# Patient Record
Sex: Female | Born: 1980 | Race: White | Hispanic: No | Marital: Married | Smoking: Never smoker
Health system: Southern US, Community
[De-identification: ages and names within clinical notes are randomized; demographics above are authoritative.]

## PROBLEM LIST (undated history)

## (undated) DIAGNOSIS — J329 Chronic sinusitis, unspecified: Secondary | ICD-10-CM

## (undated) DIAGNOSIS — T7840XA Allergy, unspecified, initial encounter: Secondary | ICD-10-CM

## (undated) HISTORY — DX: Chronic sinusitis, unspecified: J32.9

## (undated) HISTORY — DX: Allergy, unspecified, initial encounter: T78.40XA

---

## 2007-10-27 ENCOUNTER — Ambulatory Visit: Admission: RE | Admit: 2007-10-27 | Discharge: 2007-10-27 | Payer: Self-pay | Admitting: Gynecology

## 2011-03-05 NOTE — Consult Note (Signed)
Tamara Keller, KUMAGAI NO.:  [id number]   MEDICAL RECORD NO.:  [id number]          PATIENT TYPE:  OUT   LOCATION:  GYN                          FACILITY:  [facility]   PHYSICIAN:  De Blanch, M.D.DATE OF BIRTH:  [birth year]   DATE OF CONSULTATION:  10/27/2007  DATE OF DISCHARGE:                                 CONSULTATION   A 30 year old white single female seen seeking second opinion regarding  management of abnormal Pap smears.  The patient claims to have been  under the care of Dr. Annett Fabian in [place] since she was 30  years of age.  She apparently has a past history of abnormal Pap smears  that antecede records that I have which date back to 2006.  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  She  had negative margins.  She has documentation of 2 normal Pap smears in  July 2007 and October 2008, and also reports that she had a normal Pap  smear at [facility] in the interval.  She has been retested in  October 2008 for HPV and again shows high-risk types.  The patient has  received Gardasil HPV vaccine.   We had a discussion of approximately 20 minutes face-to-face time  regarding her past history.  The patient is very knowledgeable about HPV  cervical dysplasia.  In the end, we recommend that she have Pap smear  every 6 months over an indefinite period of time.  She had a number of  questions regarding the impact of LEEP procedure on subsequent  pregnancies and I indicated that while there was a slight increased risk  of preterm delivery that this is relatively infrequent.  Clearly if she  did develop further dysplasia, careful consideration should be given as  to the mode of treatment and avoidance of excessive excision of the  cervix would be most  appropriate.  All of her questions were answered  and she will return to her primary gynecologist for continuing  surveillance and gynecologic care.      De Blanch, M.D.  Electronically Signed     DC/MEDQ  D:  10/27/2007  T:  10/28/2007  Job:  [id number]   cc:   Annett Fabian, M.D.  [place], [place], R.N.  [phone] [address]  [place], [place] [address]

## 2011-11-23 LAB — HM PAP SMEAR: HM Pap smear: NORMAL

## 2013-03-22 ENCOUNTER — Encounter: Payer: Self-pay | Admitting: Internal Medicine

## 2013-03-22 ENCOUNTER — Ambulatory Visit (INDEPENDENT_AMBULATORY_CARE_PROVIDER_SITE_OTHER): Payer: BC Managed Care – PPO | Admitting: Internal Medicine

## 2013-03-22 VITALS — BP 122/70 | HR 78 | Temp 98.3°F | Ht 66.0 in | Wt 144.0 lb

## 2013-03-22 DIAGNOSIS — M629 Disorder of muscle, unspecified: Secondary | ICD-10-CM

## 2013-03-22 DIAGNOSIS — M7630 Iliotibial band syndrome, unspecified leg: Secondary | ICD-10-CM

## 2013-03-22 DIAGNOSIS — Z0000 Encounter for general adult medical examination without abnormal findings: Secondary | ICD-10-CM

## 2013-03-22 DIAGNOSIS — J329 Chronic sinusitis, unspecified: Secondary | ICD-10-CM | POA: Insufficient documentation

## 2013-03-22 NOTE — Assessment & Plan Note (Signed)
Symptoms are consistent with chronic sinusitis. No improvement with antihistamines, nasal steroids. Will set up ENT evaluation. Question if obstruction playing a role in symptoms. Also question whether repeat allergy testing might be helpful.

## 2013-03-22 NOTE — Assessment & Plan Note (Signed)
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 Discussed using roller bar for massage and changing to non-impact activities for a few weeks. If symptoms are persistent, would favor sports medicine evaluation.

## 2013-03-22 NOTE — Progress Notes (Signed)
Subjective:    Patient ID: Tamara Keller, female    DOB: 1980/11/29, 32 y.o.   MRN: [id number]  HPI 31YO female presents to establish care. Generally healthy throughout her life. Only concern is chronic sinus infections. Low level congestion at baseline with exacerbations 2-3 times per year. Has tried OTC non-sedating antihistamines, nasal afrin, and nasal steroids in the past with no improvement. Had allergy testing in distant past which was inconclusive. Never had ENT evaluation. No h/o facial trauma or surgery.  Also concerned about "clicking" in her knees when running. Typically runs about 2-3 miles several times per week. No pain or swelling in knee. No pain in leg. Clicking sound first noted few months ago. No h/o injury. Tries to run on soft surface and has good shoes.  No outpatient encounter prescriptions on file as of 03/22/2013.   No facility-administered encounter medications on file as of 03/22/2013.   BP 122/70  Pulse 78  Temp(Src) 98.3 F (36.8 C) (Oral)  Ht 5\' 6"  (1.676 m)  Wt 144 lb (65.318 kg)  BMI 23.25 kg/m2  SpO2 98%  LMP 03/15/2013  Review of Systems  Constitutional: Negative for fever, chills, appetite change, fatigue and unexpected weight change.  HENT: Positive for congestion and sinus pressure. Negative for ear pain, sore throat, trouble swallowing, neck pain and voice change.   Eyes: Negative for visual disturbance.  Respiratory: Negative for cough, shortness of breath, wheezing and stridor.   Cardiovascular: Negative for chest pain, palpitations and leg swelling.  Gastrointestinal: Negative for nausea, vomiting, abdominal pain, diarrhea, constipation, blood in stool, abdominal distention and anal bleeding.  Genitourinary: Negative for dysuria and flank pain.  Musculoskeletal: Negative for myalgias, arthralgias and gait problem.  Skin: Negative for color change and rash.  Neurological: Negative for dizziness and headaches.  Hematological: Negative for  adenopathy. Does not bruise/bleed easily.  Psychiatric/Behavioral: Negative for suicidal ideas, sleep disturbance and dysphoric mood. The patient is not nervous/anxious.        Objective:   Physical Exam  Constitutional: She is oriented to person, place, and time. She appears well-developed and well-nourished. No distress.  HENT:  Head: Normocephalic and atraumatic.  Right Ear: External ear and ear canal normal. A middle ear effusion is present.  Left Ear: External ear and ear canal normal. A middle ear effusion is present.  Nose: Nose normal.  Mouth/Throat: Oropharynx is clear and moist. No oropharyngeal exudate.  Eyes: Conjunctivae are normal. Pupils are equal, round, and reactive to light. Right eye exhibits no discharge. Left eye exhibits no discharge. No scleral icterus.  Neck: Normal range of motion. Neck supple. No tracheal deviation present. No thyromegaly present.  Cardiovascular: Normal rate, regular rhythm, normal heart sounds and intact distal pulses.  Exam reveals no gallop and no friction rub.   No murmur heard. Pulmonary/Chest: Effort normal and breath sounds normal. No accessory muscle usage. Not tachypneic. No respiratory distress. She has no decreased breath sounds. She has no wheezes. She has no rhonchi. She has no rales. She exhibits no tenderness.  Abdominal: Soft. Bowel sounds are normal. She exhibits no distension. There is no tenderness. There is no rebound.  Musculoskeletal: Normal range of motion. She exhibits no edema and no tenderness.       Right knee: She exhibits normal range of motion, no swelling, no effusion, no deformity, normal alignment, no LCL laxity and normal patellar mobility. No tenderness found.       Left knee: She exhibits normal range of motion, no swelling,  no effusion, no deformity, normal alignment, no LCL laxity and normal patellar mobility. No tenderness found.  Lymphadenopathy:    She has no cervical adenopathy.  Neurological: She is alert  and oriented to person, place, and time. No cranial nerve deficit. She exhibits normal muscle tone. Coordination normal.  Skin: Skin is warm and dry. No rash noted. She is not diaphoretic. No erythema. No pallor.  Psychiatric: She has a normal mood and affect. Her behavior is normal. Judgment and thought content normal.          Assessment & Plan:

## 2013-06-30 ENCOUNTER — Telehealth: Payer: Self-pay | Admitting: Internal Medicine

## 2013-06-30 NOTE — Telephone Encounter (Signed)
Fwd to Dr. Walker 

## 2013-06-30 NOTE — Telephone Encounter (Signed)
Referral to a Gastroenterologist and a Dermatologist as soon as possible.

## 2013-06-30 NOTE — Telephone Encounter (Signed)
Pt needs a visit to set up referrals.

## 2013-07-01 NOTE — Telephone Encounter (Signed)
Left detailed message on patient voicemail informing her to call the office and schedule an appointment.

## 2013-07-01 NOTE — Telephone Encounter (Signed)
Left message to call back  

## 2013-07-02 ENCOUNTER — Telehealth: Payer: Self-pay | Admitting: Internal Medicine

## 2013-07-02 NOTE — Telephone Encounter (Signed)
Pt asking Dr. Dan Humphreys to give her a call regarding an issue, pt wants a referral and is having to come from [place] to see Dr. Dan Humphreys just to get a referral.

## 2013-07-05 ENCOUNTER — Ambulatory Visit: Payer: BC Managed Care – PPO | Admitting: Internal Medicine

## 2013-07-05 ENCOUNTER — Telehealth: Payer: Self-pay | Admitting: Internal Medicine

## 2013-07-05 NOTE — Telephone Encounter (Signed)
Patient had scheduled an appointment for today then cancelled it.

## 2013-07-05 NOTE — Telephone Encounter (Signed)
Pt called to cancel her appt with Dr. Dan Humphreys today due to starting menstrual cycle and appt was to be for vaginal examination.  Pt would like Dr. Dan Humphreys to call her.  Pt is upset, states she did not receive a call back previously.

## 2013-07-05 NOTE — Telephone Encounter (Signed)
Left message to call back, patient could have asked to speak with me when she called to cancel or schedule her appointment. Left her a message in a previous encounter to call back but no return call was given to me.

## 2013-07-06 NOTE — Telephone Encounter (Signed)
Left message to call back  

## 2013-07-06 NOTE — Telephone Encounter (Signed)
Spoke with patient, informed her to come in discuss why she needs a referral that is the reason she needs an appointment. Stated she had already made an appointment and hung the phone up.

## 2017-05-13 ENCOUNTER — Ambulatory Visit: Payer: Self-pay | Admitting: Obstetrics and Gynecology

## 2017-06-06 ENCOUNTER — Other Ambulatory Visit
Admission: RE | Admit: 2017-06-06 | Discharge: 2017-06-06 | Disposition: A | Discharge status: Home / Self Care | Payer: Self-pay | Source: Ambulatory Visit | Attending: Obstetrics & Gynecology | Admitting: Obstetrics & Gynecology

## 2017-06-06 DIAGNOSIS — O00101 Right tubal pregnancy without intrauterine pregnancy: Secondary | ICD-10-CM | POA: Insufficient documentation

## 2017-06-06 LAB — HCG, QUANTITATIVE, PREGNANCY: HCG, BETA CHAIN, QUANT, S: 6612 m[IU]/mL — AB (ref <5)

## 2017-06-13 ENCOUNTER — Observation Stay
Admission: EM | Admit: 2017-06-13 | Discharge: 2017-06-15 | Disposition: A | Discharge status: Home / Self Care | Payer: BLUE CROSS/BLUE SHIELD | Attending: Obstetrics and Gynecology | Admitting: Obstetrics and Gynecology

## 2017-06-13 ENCOUNTER — Encounter: Payer: Self-pay | Admitting: *Deleted

## 2017-06-13 ENCOUNTER — Emergency Department: Payer: BLUE CROSS/BLUE SHIELD

## 2017-06-13 DIAGNOSIS — R102 Pelvic and perineal pain: Secondary | ICD-10-CM | POA: Diagnosis present

## 2017-06-13 DIAGNOSIS — O00101 Right tubal pregnancy without intrauterine pregnancy: Secondary | ICD-10-CM | POA: Diagnosis not present

## 2017-06-13 DIAGNOSIS — R109 Unspecified abdominal pain: Secondary | ICD-10-CM | POA: Diagnosis present

## 2017-06-13 DIAGNOSIS — Z8759 Personal history of other complications of pregnancy, childbirth and the puerperium: Secondary | ICD-10-CM

## 2017-06-13 LAB — CBC WITH DIFFERENTIAL/PLATELET
Basophils Absolute: 0.1 10*3/uL (ref 0–0.1)
Basophils Relative: 1 %
EOS PCT: 3 %
Eosinophils Absolute: 0.3 10*3/uL (ref 0–0.7)
HEMATOCRIT: 38.8 % (ref 35.0–47.0)
Hemoglobin: 13.3 g/dL (ref 12.0–16.0)
LYMPHS PCT: 47 %
Lymphs Abs: 4.2 10*3/uL — ABNORMAL HIGH (ref 1.0–3.6)
MCH: 30.8 pg (ref 26.0–34.0)
MCHC: 34.2 g/dL (ref 32.0–36.0)
MCV: 90.2 fL (ref 80.0–100.0)
MONO ABS: 0.6 10*3/uL (ref 0.2–0.9)
MONOS PCT: 7 %
NEUTROS ABS: 3.9 10*3/uL (ref 1.4–6.5)
Neutrophils Relative %: 42 %
Platelets: 239 10*3/uL (ref 150–440)
RBC: 4.3 MIL/uL (ref 3.80–5.20)
RDW: 12.1 % (ref 11.5–14.5)
WBC: 9.1 10*3/uL (ref 3.6–11.0)

## 2017-06-13 LAB — BASIC METABOLIC PANEL
ANION GAP: 11 (ref 5–15)
BUN: 13 mg/dL (ref 6–20)
CHLORIDE: 104 mmol/L (ref 101–111)
CO2: 23 mmol/L (ref 22–32)
Calcium: 9.6 mg/dL (ref 8.9–10.3)
Creatinine, Ser: 0.93 mg/dL (ref 0.44–1.00)
GFR calc non Af Amer: >60 mL/min (ref >60)
Glucose, Bld: 144 mg/dL — ABNORMAL HIGH (ref 65–99)
POTASSIUM: 3.3 mmol/L — AB (ref 3.5–5.1)
Sodium: 138 mmol/L (ref 135–145)

## 2017-06-13 LAB — PROTIME-INR
INR: 1
Prothrombin Time: 13.2 seconds (ref 11.4–15.2)

## 2017-06-13 LAB — TYPE AND SCREEN
ABO/RH(D): O POS
Antibody Screen: NEGATIVE

## 2017-06-13 LAB — APTT: APTT: 27 s (ref 24–36)

## 2017-06-13 LAB — HCG, QUANTITATIVE, PREGNANCY: hCG, Beta Chain, Quant, S: 2258 m[IU]/mL — ABNORMAL HIGH (ref <5)

## 2017-06-13 MED ORDER — MORPHINE SULFATE (PF) 4 MG/ML IV SOLN: 4.0000 mg | Freq: Once | INTRAVENOUS | Status: DC

## 2017-06-13 MED ORDER — LACTATED RINGERS IV SOLN
INTRAVENOUS | Status: DC
  Administered 2017-06-13 – 2017-06-14 (×5): via INTRAVENOUS

## 2017-06-13 MED ORDER — ONDANSETRON HCL 4 MG/2ML IJ SOLN
4.0000 mg | Freq: Once | INTRAMUSCULAR | Status: AC
  Administered 2017-06-13: 4 mg via INTRAVENOUS
  Filled 2017-06-13: qty 2

## 2017-06-13 MED ORDER — FLUTICASONE PROPIONATE 50 MCG/ACT NA SUSP
1.0000 | NASAL | Status: DC | PRN
  Administered 2017-06-13: 1 via NASAL
  Filled 2017-06-13: qty 16

## 2017-06-13 MED ORDER — SODIUM CHLORIDE 0.9 % IV BOLUS (SEPSIS)
1000.0000 mL | Freq: Once | INTRAVENOUS | Status: AC
  Administered 2017-06-13: 1000 mL via INTRAVENOUS

## 2017-06-13 MED ORDER — KETOROLAC TROMETHAMINE 30 MG/ML IJ SOLN
15.0000 mg | Freq: Once | INTRAMUSCULAR | Status: AC
  Administered 2017-06-13: 15 mg via INTRAVENOUS
  Filled 2017-06-13: qty 1

## 2017-06-13 MED ORDER — ZOLPIDEM TARTRATE 5 MG PO TABS
5.0000 mg | ORAL_TABLET | Freq: Every evening | ORAL | Status: DC | PRN
Start: 2017-06-13 — End: 2017-06-15

## 2017-06-13 MED ORDER — KETOROLAC TROMETHAMINE 30 MG/ML IJ SOLN: 15.0000 mg | Freq: Three times a day (TID) | INTRAMUSCULAR | Status: DC | PRN

## 2017-06-13 MED ORDER — SODIUM CHLORIDE 0.9 % IV SOLN
Freq: Once | INTRAVENOUS | Status: AC
  Administered 2017-06-13: 14:00:00 via INTRAVENOUS

## 2017-06-13 MED ORDER — KETOROLAC TROMETHAMINE 15 MG/ML IJ SOLN
15.0000 mg | Freq: Three times a day (TID) | INTRAMUSCULAR | Status: DC | PRN
  Administered 2017-06-14 (×2): 15 mg via INTRAVENOUS
  Filled 2017-06-13 (×3): qty 1

## 2017-06-13 NOTE — ED Notes (Signed)
Assisted the patient to the bathroom to void. She got up with assistance and had no issues.

## 2017-06-13 NOTE — ED Provider Notes (Signed)
[facility] Emergency Department Provider Note  ____________________________________________  Time seen: Approximately 11:34 AM  I have reviewed the triage vital signs and the nursing notes.   HISTORY  Chief Complaint Abdominal Pain   HPI Tamara Keller is a 36 y.o. female currently being treated for an ectopic pregnancy with methotrexate who presents for evaluation of abdominal pain and vaginal bleeding. Patient received methotrexate 2 weeks ago. She initially had a small amount of bleeding however that had subsided. Todayshe started to have spotting which she describes as a small amount of thick blood. That was associated with severe lower cramping abdominal pain. Patient started feeling like she was going to pass out which prompted her husband to drive her to the emergency room. Patient denies passing blood clots or hemorrhaging. She denies chest pain or shortness of breath.  Past Medical History:  Diagnosis Date  . Allergy    seasonal   . Sinusitis    chronic, 2-3times per year    Patient Active Problem List   Diagnosis Date Noted  . Abdominal pain 06/13/2017  . Sinusitis, chronic 03/22/2013  . 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 No pertinent surgical history.  Prior to Admission medications   Medication Sig Start Date End Date Taking? Authorizing Provider  fluticasone (FLONASE) 50 MCG/ACT nasal spray Place 2 sprays into both nostrils daily. 05/07/17  Yes [provider]  LORazepam (ATIVAN) 0.5 MG tablet Take 0.5 mg by mouth every 8 (eight) hours as needed for anxiety. 05/19/17  Yes [provider]    Allergies Patient has no known allergies.  Family History  Problem Relation Age of Onset  . Arthritis Mother   . Hypertension Mother   . Mental illness Mother   . Alcohol abuse Father   . Mental illness Father   . Diabetes Maternal Grandmother   . Cancer Maternal Grandfather        lung  .  Arthritis Paternal Grandmother     Social History Social History  Substance Use Topics  . Smoking status: Never Smoker  . Smokeless tobacco: Not on file  . Alcohol use No    Review of Systems  Constitutional: Negative for fever. Eyes: Negative for visual changes. ENT: Negative for sore throat. Neck: No neck pain  Cardiovascular: Negative for chest pain. Respiratory: Negative for shortness of breath. Gastrointestinal: + lower abdominal pain. No vomiting or diarrhea. Genitourinary: Negative for dysuria. + vaginal bleeding Musculoskeletal: Negative for back pain. Skin: Negative for rash. Neurological: Negative for headaches, weakness or numbness. Psych: No SI or HI  ____________________________________________   PHYSICAL EXAM:  VITAL SIGNS: ED Triage Vitals  Enc Vitals Group     BP 06/13/17 1054 102/71     Pulse Rate 06/13/17 1054 (!) 58     Resp 06/13/17 1054 20     Temp 06/13/17 1054 97.7 F (36.5 C)     Temp Source 06/13/17 1054 Oral     SpO2 06/13/17 1054 100 %     Weight 06/13/17 1054 160 lb (72.6 kg)     Height 06/13/17 1054 5\' 7"  (1.702 m)     Head Circumference --      Peak Flow --      Pain Score 06/13/17 1053 5     Pain Loc --      Pain Edu? --      Excl. in GC? --     Constitutional: Alert and oriented, pale, diaphoretic.  HEENT:  Head: Normocephalic and atraumatic.         Eyes: Conjunctivae are normal. Sclera is non-icteric.       Mouth/Throat: Mucous membranes are moist.       Neck: Supple with no signs of meningismus. Cardiovascular: Regular rate and rhythm. No murmurs, gallops, or rubs. 2+ symmetrical distal pulses are present in all extremities. No JVD. Respiratory: Normal respiratory effort. Lungs are clear to auscultation bilaterally. No wheezes, crackles, or rhonchi.  Gastrointestinal: Soft, significant ttp over the lower quadrants worse in the suprapubic region. No rebound or guarding Musculoskeletal: Nontender with normal range of  motion in all extremities. No edema, cyanosis, or erythema of extremities. Neurologic: Normal speech and language. Face is symmetric. Moving all extremities. No gross focal neurologic deficits are appreciated. Skin: Skin is warm, dry and intact. No rash noted. Psychiatric: Mood and affect are normal. Speech and behavior are normal.  ____________________________________________   LABS (all labs ordered are listed, but only abnormal results are displayed)  Labs Reviewed  CBC WITH DIFFERENTIAL/PLATELET - Abnormal; Notable for the following:       Result Value   Lymphs Abs 4.2 (*)    All other components within normal limits  HCG, QUANTITATIVE, PREGNANCY - Abnormal; Notable for the following:    hCG, Beta Chain, Quant, S 2,258 (*)    All other components within normal limits  BASIC METABOLIC PANEL - Abnormal; Notable for the following:    Potassium 3.3 (*)    Glucose, Bld 144 (*)    All other components within normal limits  PROTIME-INR  APTT  TYPE AND SCREEN   ____________________________________________  EKG  none  ____________________________________________  RADIOLOGY  TVUS: 3.1 cm complex but predominantly solid right paraovarian mass is noted which may represent ectopic pregnancy or residual ectopic pregnancy. Correlation with beta HCG levels is recommended. Critical Value/emergent results were called by telephone at the time of interpretation on 06/13/2017 at 1:13 pm to Dr. Nita Sickle , who verbally acknowledged these results. ____________________________________________   PROCEDURES  Procedure(s) performed:yes Procedures   FAST BEDSIDE US Indication: ectopic pregnancy and severe abdominal pain  3 Views obtained: Splenorenal, Morrison's Pouch, Retrovesical, Pericardial No free fluid in abdomen No difficulty obtaining views. I personally performed and interrepreted the images  Critical Care performed: yes  CRITICAL CARE Performed by: Nita Sickle  ?  Total critical care time: 35 min  Critical care time was exclusive of separately billable procedures and treating other patients.  Critical care was necessary to treat or prevent imminent or life-threatening deterioration.  Critical care was time spent personally by me on the following activities: development of treatment plan with patient and/or surrogate as well as nursing, discussions with consultants, evaluation of patient's response to treatment, examination of patient, obtaining history from patient or surrogate, ordering and performing treatments and interventions, ordering and review of laboratory studies, ordering and review of radiographic studies, pulse oximetry and re-evaluation of patient's condition.  ____________________________________________   INITIAL IMPRESSION / ASSESSMENT AND PLAN / ED COURSE  36 y.o. female currently being treated for an ectopic pregnancy with methotrexate who presents for evaluation of abdominal pain and vaginal bleeding and near syncope. Patient arrives pale and diaphoretic, slightly hypotensive with bradycardia. Bedside ultrasound with no free fluid. 2 large bore IVs were placed and patient was started on normal saline with improvement of her blood pressure. Hemoglobin stable at 13.3. Coags within normal limits. Type and screen was sent. Patient was given Toradol for pain. Beta Quant is pending.  Discussed with Dr. Feliberto Gottron who recommended repeating TVUS which has been ordered.     _________________________ 3:50 PM on 06/13/2017 -----------------------------------------  Ultrasound consistent with persistent ectopic pregnancy. Patient's vital signs improved with IV fluids. She was admitted to OB/GYN for further management.  Pertinent labs & imaging results that were available during my care of the patient were reviewed by me and considered in my medical decision making (see chart for  details).    ____________________________________________   FINAL CLINICAL IMPRESSION(S) / ED DIAGNOSES  Final diagnoses:  Abdominal pain  Right tubal pregnancy without intrauterine pregnancy      NEW MEDICATIONS STARTED DURING THIS VISIT:  New Prescriptions   No medications on file     Note:  This document was prepared using Dragon voice recognition software and may include unintentional dictation errors.    Don Perking, [place], MD 06/13/17 [phone]

## 2017-06-13 NOTE — H&P (Addendum)
Tamara Keller is an 36 y.o. female being followed for an ectopic pregnancy . EGA 7+2 weeks by LMP . She has been under the care of Dr Elesa Massed when she presented to the ED with right sided pain and light- headedness and emesis . Pain sharp and on right . STill spottting . She is s/p MTX tx on 8/10 18 . Quants have decrease  .from 12,408 on 05/27/17 to [id number]--10,[id number]--,6612-- to 2258 today. U/s today show a comples right adnexal mass 3.1 cm which is increased in size from 2.7 cm from 05/27/17.   Pertinent Gynecological History: lmp 04/23/17 Menstrual History:  Patient's last menstrual period was 06/13/2017.    Past Medical History:  Diagnosis Date  . Allergy    seasonal   . Sinusitis    chronic, 2-3times per year    History reviewed. No pertinent surgical history.  Family History  Problem Relation Age of Onset  . Arthritis Mother   . Hypertension Mother   . Mental illness Mother   . Alcohol abuse Father   . Mental illness Father   . Diabetes Maternal Grandmother   . Cancer Maternal Grandfather        lung  . Arthritis Paternal Grandmother     Social History:  reports that she has never smoked. She does not have any smokeless tobacco history on file. She reports that she does not drink alcohol or use drugs.  Allergies: No Known Allergies   (Not in a hospital admission)  ROS Review of Systems: General:                      No fatigue or weight loss Eyes:                           No vision changes Ears:                            No hearing difficulty Respiratory:                No cough or shortness of breath Pulmonary:                  No asthma or shortness of breath Cardiovascular:           No chest pain, palpitations, dyspnea on exertion Gastrointestinal:          No abdominal bloating, chronic diarrhea, constipation, masses, pain or hematochezia Genitourinary:             No hematuria, dysuria, abnormal vaginal discharge, + pelvic pain right ectopic ,  Menometrorrhagia Lymphatic:                   No swollen lymph nodes Musculoskeletal:         No muscle weakness Neurologic:                  No extremity weakness, syncope, seizure disorder Psychiatric:                  No history of depression, delusions or suicidal/homicidal ideation    Blood pressure 108/61, pulse (!) 58, temperature 97.7 F (36.5 C), temperature source Oral, resp. rate 13, height 5\' 7"  (1.702 m), weight 72.6 kg (160 lb), last menstrual period 06/13/2017, SpO2 100 %. Physical Exam   wdwn w female in NAD  Lungs CTA  CV RRR ADb soft  NT , no rebound  Pelvic bimanual :  cx no CMT  + right adnexal fullness and 1-2+ TTP . Left no mass NT    Results for orders placed or performed during the hospital encounter of 06/13/17 (from the past 24 hour(s))  CBC with Differential/Platelet     Status: Abnormal   Collection Time: 06/13/17 11:02 AM  Result Value Ref Range   WBC 9.1 3.6 - 11.0 K/uL   RBC 4.30 3.80 - 5.20 MIL/uL   Hemoglobin 13.3 12.0 - 16.0 g/dL   HCT 16.1 09.6 - 04.5 %   MCV 90.2 80.0 - 100.0 fL   MCH 30.8 26.0 - 34.0 pg   MCHC 34.2 32.0 - 36.0 g/dL   RDW 40.9 81.1 - 91.4 %   Platelets 239 150 - 440 K/uL   Neutrophils Relative % 42 %   Neutro Abs 3.9 1.4 - 6.5 K/uL   Lymphocytes Relative 47 %   Lymphs Abs 4.2 (H) 1.0 - 3.6 K/uL   Monocytes Relative 7 %   Monocytes Absolute 0.6 0.2 - 0.9 K/uL   Eosinophils Relative 3 %   Eosinophils Absolute 0.3 0 - 0.7 K/uL   Basophils Relative 1 %   Basophils Absolute 0.1 0 - 0.1 K/uL  Type and screen     Status: None   Collection Time: 06/13/17 11:02 AM  Result Value Ref Range   ABO/RH(D) O POS    Antibody Screen NEG    Sample Expiration 06/16/2017   Basic metabolic panel     Status: Abnormal   Collection Time: 06/13/17 11:02 AM  Result Value Ref Range   Sodium 138 135 - 145 mmol/L   Potassium 3.3 (L) 3.5 - 5.1 mmol/L   Chloride 104 101 - 111 mmol/L   CO2 23 22 - 32 mmol/L   Glucose, Bld 144 (H) 65 - 99  mg/dL   BUN 13 6 - 20 mg/dL   Creatinine, Ser 7.82 0.44 - 1.00 mg/dL   Calcium 9.6 8.9 - 95.6 mg/dL   GFR calc non Af Amer >60 >60 mL/min   GFR calc Af Amer >60 >60 mL/min   Anion gap 11 5 - 15  Protime-INR     Status: None   Collection Time: 06/13/17 11:02 AM  Result Value Ref Range   Prothrombin Time 13.2 11.4 - 15.2 seconds   INR 1.00   APTT     Status: None   Collection Time: 06/13/17 11:02 AM  Result Value Ref Range   aPTT 27 24 - 36 seconds  hCG, quantitative, pregnancy     Status: Abnormal   Collection Time: 06/13/17 11:03 AM  Result Value Ref Range   hCG, Beta Chain, Quant, S 2,258 (H) <5 mIU/mL    US Transvaginal Non-ob  Result Date: 06/13/2017 CLINICAL DATA:  Pelvic pain status post methotrexate treatment for ectopic pregnancy. EXAM: TRANSABDOMINAL AND TRANSVAGINAL ULTRASOUND OF PELVIS TECHNIQUE: Both transabdominal and transvaginal ultrasound examinations of the pelvis were performed. Transabdominal technique was performed for global imaging of the pelvis including uterus, ovaries, adnexal regions, and pelvic cul-de-sac. It was necessary to proceed with endovaginal exam following the transabdominal exam to visualize the ovaries and adnexal regions. COMPARISON:  None. FINDINGS: Uterus Measurements: 6.7 x 4.2 x 2.7 cm. No fibroids or other mass visualized. Endometrium Thickness: 2.8 mm which is within normal limits. No focal abnormality visualized. Right ovary Measurements: 3.3 x 2.1 x 1.9 cm. Follicular cysts are noted in the right ovary. However, complex but predominantly solid  abnormality measuring 3.1 x 2.9 x 2.8 cm is noted adjacent to right ovary which may represent ectopic pregnancy or residual ectopic pregnancy. Clinical correlation and correlation with beta HCG levels is recommended. Left ovary Measurements: 2.6 x 1.4 x 1.3 cm. Normal appearance/no adnexal mass. Other findings Small amount of free fluid is noted in Morrison's pouch which may be related to ectopic pregnancy.  IMPRESSION: 3.1 cm complex but predominantly solid right paraovarian mass is noted which may represent ectopic pregnancy or residual ectopic pregnancy. Correlation with beta HCG levels is recommended. Critical Value/emergent results were called by telephone at the time of interpretation on 06/13/2017 at 1:13 pm to Dr. Nita Sickle , who verbally acknowledged these results. Electronically Signed   By: Lupita Raider, M.D.   On: 06/13/2017 13:13   US Pelvis Complete  Result Date: 06/13/2017 CLINICAL DATA:  Pelvic pain status post methotrexate treatment for ectopic pregnancy. EXAM: TRANSABDOMINAL AND TRANSVAGINAL ULTRASOUND OF PELVIS TECHNIQUE: Both transabdominal and transvaginal ultrasound examinations of the pelvis were performed. Transabdominal technique was performed for global imaging of the pelvis including uterus, ovaries, adnexal regions, and pelvic cul-de-sac. It was necessary to proceed with endovaginal exam following the transabdominal exam to visualize the ovaries and adnexal regions. COMPARISON:  None. FINDINGS: Uterus Measurements: 6.7 x 4.2 x 2.7 cm. No fibroids or other mass visualized. Endometrium Thickness: 2.8 mm which is within normal limits. No focal abnormality visualized. Right ovary Measurements: 3.3 x 2.1 x 1.9 cm. Follicular cysts are noted in the right ovary. However, complex but predominantly solid abnormality measuring 3.1 x 2.9 x 2.8 cm is noted adjacent to right ovary which may represent ectopic pregnancy or residual ectopic pregnancy. Clinical correlation and correlation with beta HCG levels is recommended. Left ovary Measurements: 2.6 x 1.4 x 1.3 cm. Normal appearance/no adnexal mass. Other findings Small amount of free fluid is noted in Morrison's pouch which may be related to ectopic pregnancy. IMPRESSION: 3.1 cm complex but predominantly solid right paraovarian mass is noted which may represent ectopic pregnancy or residual ectopic pregnancy. Correlation with beta HCG  levels is recommended. Critical Value/emergent results were called by telephone at the time of interpretation on 06/13/2017 at 1:13 pm to Dr. Nita Sickle , who verbally acknowledged these results. Electronically Signed   By: Lupita Raider, M.D.   On: 06/13/2017 13:13    Assessment/Plan: Right tubal ectopic  With acute worsening of pain today . MAss slightly enlarged for last u/s on 05/27/17 . Quants are decreasing appropriately for MTX treatment .  No fluid in pelvis.  I am concerned that that with her pain that she could have been rupturing her fallopian tube but too soon to see a build up of blood .  My recommendation to her is a L/S evaluation and possible right salpingostomy/ salpingectomy  With possibility of a right oophorectomy if an ovarian ectopic is seen .  Pt declines this treatment currently even after full explanation  and wishes to undergo close observation . She is aware that ectopic pregnancies can bleed very quickly and her life could be endangered if this were to happen . Therefore I will observe her  Overnight and repeat an u/s and HCG in am . If she does not have appreciable pain overnight / tomorrow and there is still no increased fluid in the pelvic and hcg continues to drop  I can continue to follow . If PAin increases and/ or there is  free fluid in the pelvis tomorrow or quant  rises I will again recommend to the pt the need for surgery .   Ihor Austin Amalia Edgecombe 06/13/2017, 3:25 PM

## 2017-06-13 NOTE — ED Notes (Signed)
OB MD at bedside

## 2017-06-13 NOTE — ED Notes (Signed)
The EKG was completed and singed by Dr. Mayford Knife. The EKG was also exported into the system.

## 2017-06-13 NOTE — ED Triage Notes (Signed)
Pt was treated by Dr Elesa Massed 2 weeks ago for an etopic pregancy, pt woke up today with increased vaginal bleeding and abdominal cramping, pt is pale and diaphoretic, pt alert and oriented, pt taken back to room 13

## 2017-06-14 ENCOUNTER — Observation Stay: Payer: BLUE CROSS/BLUE SHIELD | Admitting: Certified Registered Nurse Anesthetist

## 2017-06-14 ENCOUNTER — Observation Stay: Payer: BLUE CROSS/BLUE SHIELD

## 2017-06-14 ENCOUNTER — Encounter: Payer: Self-pay | Admitting: Certified Registered Nurse Anesthetist

## 2017-06-14 ENCOUNTER — Encounter
Admission: EM | Disposition: A | Discharge status: Home / Self Care | Payer: Self-pay | Source: Home / Self Care | Attending: Emergency Medicine

## 2017-06-14 HISTORY — PX: DIAGNOSTIC LAPAROSCOPY WITH REMOVAL OF ECTOPIC PREGNANCY: SHX6449

## 2017-06-14 LAB — CBC
HCT: 32.2 % — ABNORMAL LOW (ref 35.0–47.0)
Hemoglobin: 11.2 g/dL — ABNORMAL LOW (ref 12.0–16.0)
MCH: 31.6 pg (ref 26.0–34.0)
MCHC: 34.7 g/dL (ref 32.0–36.0)
MCV: 90.9 fL (ref 80.0–100.0)
PLATELETS: 154 10*3/uL (ref 150–440)
RBC: 3.54 MIL/uL — AB (ref 3.80–5.20)
RDW: 11.8 % (ref 11.5–14.5)
WBC: 5.4 10*3/uL (ref 3.6–11.0)

## 2017-06-14 LAB — HCG, QUANTITATIVE, PREGNANCY: HCG, BETA CHAIN, QUANT, S: 1572 m[IU]/mL — AB (ref <5)

## 2017-06-14 SURGERY — LAPAROSCOPY, WITH ECTOPIC PREGNANCY SURGICAL TREATMENT
Anesthesia: General | Wound class: Clean Contaminated

## 2017-06-14 MED ORDER — FENTANYL CITRATE (PF) 100 MCG/2ML IJ SOLN
25.0000 ug | INTRAMUSCULAR | Status: DC | PRN
  Administered 2017-06-14: 25 ug via INTRAVENOUS

## 2017-06-14 MED ORDER — FENTANYL CITRATE (PF) 100 MCG/2ML IJ SOLN
INTRAMUSCULAR | Status: AC
  Filled 2017-06-14: qty 2

## 2017-06-14 MED ORDER — KETOROLAC TROMETHAMINE 30 MG/ML IJ SOLN
INTRAMUSCULAR | Status: DC | PRN
  Administered 2017-06-14: 30 mg via INTRAVENOUS

## 2017-06-14 MED ORDER — LIDOCAINE HCL (CARDIAC) 20 MG/ML IV SOLN
INTRAVENOUS | Status: DC | PRN
  Administered 2017-06-14: 80 mg via INTRAVENOUS

## 2017-06-14 MED ORDER — MIDAZOLAM HCL 2 MG/2ML IJ SOLN
INTRAMUSCULAR | Status: DC | PRN
  Administered 2017-06-14: 2 mg via INTRAVENOUS

## 2017-06-14 MED ORDER — DEXAMETHASONE SODIUM PHOSPHATE 10 MG/ML IJ SOLN
INTRAMUSCULAR | Status: DC | PRN
  Administered 2017-06-14: 10 mg via INTRAVENOUS

## 2017-06-14 MED ORDER — ROCURONIUM BROMIDE 100 MG/10ML IV SOLN
INTRAVENOUS | Status: DC | PRN
  Administered 2017-06-14 (×2): 10 mg via INTRAVENOUS

## 2017-06-14 MED ORDER — PROPOFOL 10 MG/ML IV BOLUS
INTRAVENOUS | Status: DC | PRN
  Administered 2017-06-14: 140 mg via INTRAVENOUS
  Administered 2017-06-14: 60 mg via INTRAVENOUS

## 2017-06-14 MED ORDER — GLYCOPYRROLATE 0.2 MG/ML IJ SOLN
INTRAMUSCULAR | Status: AC
  Filled 2017-06-14: qty 1

## 2017-06-14 MED ORDER — MIDAZOLAM HCL 2 MG/2ML IJ SOLN
INTRAMUSCULAR | Status: AC
  Filled 2017-06-14: qty 2

## 2017-06-14 MED ORDER — FENTANYL CITRATE (PF) 100 MCG/2ML IJ SOLN
INTRAMUSCULAR | Status: AC
  Administered 2017-06-14: 25 ug via INTRAVENOUS
  Filled 2017-06-14: qty 2

## 2017-06-14 MED ORDER — ONDANSETRON HCL 4 MG/2ML IJ SOLN
INTRAMUSCULAR | Status: DC | PRN
Start: 2017-06-14 — End: 2017-06-14
  Administered 2017-06-14: 4 mg via INTRAVENOUS

## 2017-06-14 MED ORDER — SUCCINYLCHOLINE CHLORIDE 20 MG/ML IJ SOLN
INTRAMUSCULAR | Status: DC | PRN
  Administered 2017-06-14: 100 mg via INTRAVENOUS

## 2017-06-14 MED ORDER — PROMETHAZINE HCL 25 MG/ML IJ SOLN: 6.2500 mg | INTRAMUSCULAR | Status: DC | PRN

## 2017-06-14 MED ORDER — PROPOFOL 10 MG/ML IV BOLUS
INTRAVENOUS | Status: AC
  Filled 2017-06-14: qty 20

## 2017-06-14 MED ORDER — ONDANSETRON HCL 4 MG/2ML IJ SOLN
INTRAMUSCULAR | Status: AC
  Filled 2017-06-14: qty 2

## 2017-06-14 MED ORDER — KETOROLAC TROMETHAMINE 30 MG/ML IJ SOLN
INTRAMUSCULAR | Status: AC
  Filled 2017-06-14: qty 1

## 2017-06-14 MED ORDER — SILVER NITRATE-POT NITRATE 75-25 % EX MISC
CUTANEOUS | Status: AC
  Filled 2017-06-14: qty 1

## 2017-06-14 MED ORDER — BUPIVACAINE HCL 0.5 % IJ SOLN
INTRAMUSCULAR | Status: DC | PRN
  Administered 2017-06-14: 10 mL

## 2017-06-14 MED ORDER — ROCURONIUM BROMIDE 50 MG/5ML IV SOLN
INTRAVENOUS | Status: AC
  Filled 2017-06-14: qty 1

## 2017-06-14 MED ORDER — SUGAMMADEX SODIUM 200 MG/2ML IV SOLN
INTRAVENOUS | Status: DC | PRN
  Administered 2017-06-14: 175 mg via INTRAVENOUS

## 2017-06-14 MED ORDER — DEXAMETHASONE SODIUM PHOSPHATE 10 MG/ML IJ SOLN
INTRAMUSCULAR | Status: AC
  Filled 2017-06-14: qty 1

## 2017-06-14 MED ORDER — OXYCODONE-ACETAMINOPHEN 5-325 MG PO TABS
1.0000 | ORAL_TABLET | ORAL | Status: DC | PRN
  Administered 2017-06-15: 1 via ORAL
  Filled 2017-06-14: qty 1

## 2017-06-14 MED ORDER — SODIUM CHLORIDE 0.9 % IV SOLN
INTRAVENOUS | Status: DC | PRN
  Administered 2017-06-14: 20:00:00 via INTRAVENOUS

## 2017-06-14 MED ORDER — FENTANYL CITRATE (PF) 100 MCG/2ML IJ SOLN
INTRAMUSCULAR | Status: DC | PRN
  Administered 2017-06-14 (×2): 50 ug via INTRAVENOUS

## 2017-06-14 SURGICAL SUPPLY — 39 items
BAG URO DRAIN 2000ML W/SPOUT (MISCELLANEOUS) ×3 IMPLANT
BLADE SURG SZ11 CARB STEEL (BLADE) ×3 IMPLANT
CANISTER SUCT 1200ML W/VALVE (MISCELLANEOUS) ×3 IMPLANT
CATH ROBINSON RED A/P 16FR (CATHETERS) ×3 IMPLANT
CHLORAPREP W/TINT 26ML (MISCELLANEOUS) ×3 IMPLANT
CLOSURE WOUND 1/4X4 (GAUZE/BANDAGES/DRESSINGS) ×1
DERMABOND ADVANCED (GAUZE/BANDAGES/DRESSINGS) ×2
DERMABOND ADVANCED .7 DNX12 (GAUZE/BANDAGES/DRESSINGS) ×1 IMPLANT
DRSG TEGADERM 2-3/8X2-3/4 SM (GAUZE/BANDAGES/DRESSINGS) ×9 IMPLANT
GAUZE SPONGE NON-WVN 2X2 STRL (MISCELLANEOUS) ×3 IMPLANT
GLOVE BIO SURGEON STRL SZ8 (GLOVE) ×6 IMPLANT
GOWN STRL REUS W/ TWL LRG LVL3 (GOWN DISPOSABLE) ×2 IMPLANT
GOWN STRL REUS W/ TWL XL LVL3 (GOWN DISPOSABLE) ×1 IMPLANT
GOWN STRL REUS W/TWL LRG LVL3 (GOWN DISPOSABLE) ×4
GOWN STRL REUS W/TWL XL LVL3 (GOWN DISPOSABLE) ×2
GRASPER SUT TROCAR 14GX15 (MISCELLANEOUS) ×3 IMPLANT
IRRIGATION STRYKERFLOW (MISCELLANEOUS) ×1 IMPLANT
IRRIGATOR STRYKERFLOW (MISCELLANEOUS) ×3
IV NS 1000ML (IV SOLUTION) ×2
IV NS 1000ML BAXH (IV SOLUTION) ×1 IMPLANT
KIT RM TURNOVER CYSTO AR (KITS) ×3 IMPLANT
LABEL OR SOLS (LABEL) ×3 IMPLANT
NS IRRIG 500ML POUR BTL (IV SOLUTION) ×3 IMPLANT
PACK GYN LAPAROSCOPIC (MISCELLANEOUS) ×3 IMPLANT
PAD OB MATERNITY 4.3X12.25 (PERSONAL CARE ITEMS) ×3 IMPLANT
PAD PREP 24X41 OB/GYN DISP (PERSONAL CARE ITEMS) ×3 IMPLANT
POUCH SPECIMEN RETRIEVAL 10MM (ENDOMECHANICALS) ×3 IMPLANT
SHEARS HARMONIC ACE PLUS 36CM (ENDOMECHANICALS) ×3 IMPLANT
SPONGE VERSALON 2X2 STRL (MISCELLANEOUS) ×6
STRIP CLOSURE SKIN 1/4X4 (GAUZE/BANDAGES/DRESSINGS) ×2 IMPLANT
SUT VIC AB 0 CT1 36 (SUTURE) ×3 IMPLANT
SUT VIC AB 2-0 UR6 27 (SUTURE) ×3 IMPLANT
SUT VIC AB 4-0 SH 27 (SUTURE) ×2
SUT VIC AB 4-0 SH 27XANBCTRL (SUTURE) ×1 IMPLANT
SWABSTK COMLB BENZOIN TINCTURE (MISCELLANEOUS) ×3 IMPLANT
TROCAR ENDO BLADELESS 11MM (ENDOMECHANICALS) ×3 IMPLANT
TROCAR XCEL NON-BLD 5MMX100MML (ENDOMECHANICALS) ×3 IMPLANT
TROCAR XCEL UNIV SLVE 11M 100M (ENDOMECHANICALS) ×3 IMPLANT
TUBING INSUFFLATOR HI FLOW (MISCELLANEOUS) ×3 IMPLANT

## 2017-06-14 NOTE — Plan of Care (Signed)
Problem: Fluid Volume: Goal: Ability to maintain a balanced intake and output will improve Outcome: Progressing Pt has been on clear liquids and NPO per MD orders.   Problem: Nutrition: Goal: Adequate nutrition will be maintained Outcome: Progressing NPO at this time

## 2017-06-14 NOTE — Anesthesia Postprocedure Evaluation (Signed)
Anesthesia Post Note  Patient: Tamara Keller  Procedure(s) Performed: Procedure(s) (LRB): DIAGNOSTIC LAPAROSCOPY WITH REMOVAL OF ECTOPIC PREGNANCY (N/A)  Patient location during evaluation: PACU Anesthesia Type: General Level of consciousness: awake and alert Pain management: pain level controlled Vital Signs Assessment: post-procedure vital signs reviewed and stable Respiratory status: spontaneous breathing, nonlabored ventilation, respiratory function stable and patient connected to nasal cannula oxygen Cardiovascular status: blood pressure returned to baseline and stable Postop Assessment: no signs of nausea or vomiting Anesthetic complications: no     Last Vitals:  Vitals:   06/14/17 2200 06/14/17 2230  BP: 116/77 (!) 108/58  Pulse: 62 (!) 50  Resp: 10 18  Temp: 36.7 C 36.7 C  SpO2: 99% 100%    Last Pain:  Vitals:   06/14/17 2230  TempSrc: Oral  PainSc:                  Lenard Simmer

## 2017-06-14 NOTE — Discharge Summary (Signed)
Physician Discharge Summary  Patient ID: Tamara Keller MRN: [id number] DOB/AGE: Oct 23, 1980 36 y.o.  Admit date: 06/13/2017 Discharge date: 06/14/2017  Admission Diagnoses:right ectopic pregnancy  Discharge Diagnoses: same ,. Active Problems:   Abdominal pain   Discharged Condition: good Hospital Course:pt observed overnight and repeat U/S showed increased size in adnexal mass and new free fluid in cul-de-sac. HCT dropped from 38 to 32 %. She underwent a L/S right salpingectomy with tubal ectopic   Consults: none Significant Diagnostic Studies: u/s x2  Treatments: surgery as above Discharge Exam: Blood pressure (!) 108/58, pulse (!) 50, temperature 98 F (36.7 C), temperature source Oral, resp. rate 18, height 5\' 6"  (1.676 m), weight 80.9 kg (178 lb 6 oz), last menstrual period 06/13/2017, SpO2 100 %. Lungs CTA  CV RRR     Disposition:d/c to home in good condition  Discharge Instructions    Call MD for:    Complete by:  As directed    Heavy vaginal bleeding or increasing abdominal pain   Call MD for:  difficulty breathing, headache or visual disturbances    Complete by:  As directed    Call MD for:  extreme fatigue    Complete by:  As directed    Call MD for:  hives    Complete by:  As directed    Call MD for:  persistant dizziness or light-headedness    Complete by:  As directed    Call MD for:  persistant nausea and vomiting    Complete by:  As directed    Call MD for:  redness, tenderness, or signs of infection (pain, swelling, redness, odor or green/yellow discharge around incision site)    Complete by:  As directed    Call MD for:  severe uncontrolled pain    Complete by:  As directed    Call MD for:  temperature >100.4    Complete by:  As directed    Diet - low sodium heart healthy    Complete by:  As directed    Increase activity slowly    Complete by:  As directed       Follow-up Information    Jani Ploeger, Ihor Austin, MD Follow up in 2 week(s).    Specialty:  Obstetrics and Gynecology Contact information: [address] [place] [address] [phone]           Signed: Ihor Austin Jazyah Butsch 06/14/2017, 10:59 PM

## 2017-06-14 NOTE — Transfer of Care (Signed)
Immediate Anesthesia Transfer of Care Note  Patient: Tamara Keller  Procedure(s) Performed: Procedure(s): DIAGNOSTIC LAPAROSCOPY WITH REMOVAL OF ECTOPIC PREGNANCY (N/A)  Patient Location: PACU  Anesthesia Type:General  Level of Consciousness: sedated  Airway & Oxygen Therapy: Patient Spontanous Breathing and Patient connected to face mask oxygen  Post-op Assessment: Report given to RN and Post -op Vital signs reviewed and stable  Post vital signs: Reviewed and stable  Last Vitals:  Vitals:   06/14/17 1659 06/14/17 1937  BP: 100/65 118/64  Pulse: 60 (!) 55  Resp:  20  Temp: 37.1 C 36.9 C  SpO2: 99% 100%    Last Pain:  Vitals:   06/14/17 1937  TempSrc: Oral  PainSc:          Complications: No apparent anesthesia complications

## 2017-06-14 NOTE — Anesthesia Post-op Follow-up Note (Signed)
Anesthesia QCDR form completed.        

## 2017-06-14 NOTE — Op Note (Signed)
NAME:  Tamara Keller, Tamara Keller    ACCOUNT NO.:  [id number]  MEDICAL RECORD NO.:  [id number]  LOCATION:  348A                         FACILITY:  [facility]  PHYSICIAN:  Jennell Corner, MDDATE OF BIRTH:  16-Jan-1981  DATE OF PROCEDURE: DATE OF DISCHARGE:06/14/2017                              OPERATIVE REPORT   PREOPERATIVE DIAGNOSIS:  Right tubal ectopic pregnancy.  POSTOPERATIVE DIAGNOSIS:  Right tubal ectopic pregnancy.  PROCEDURE PERFORMED:  Laparoscopic right salpingectomy.  SURGEON:  Jennell Corner, MD  ANESTHESIA:  General endotracheal anesthesia.  SURGEON:  Jennell Corner, MD  FIRST ASSISTANT:  Scrub tech.  INDICATION:  A 36 year old, gravida 1, para 0 patient, admitted the day prior to the surgery with a known right ectopic pregnancy  on the day of this surgery, which was June 14, 2017, the patient underwent a second ultrasound that showed increasing right adnexal mass with free fluid and drop of hematocrit.  DESCRIPTION OF PROCEDURE:  After adequate general endotracheal anesthesia, the patient was placed in dorsal supine position with the legs in the Hollister stirrups.  The patient's abdomen, perineum, and vagina were prepped and draped in a normal sterile fashion.  Time-out was performed.  Straight catheterization of the bladder yielded 200 mL of clear urine.  Speculum was placed in the vagina, and the anterior cervix was grasped with a single-tooth tenaculum, and a Kahn cannula was placed into the endocervical canal to be used for uterine manipulation during the procedure.  Gloves were changed, and attention was directed to the abdomen.  A 15-mm infraumbilical incision was made after injecting with 0.5% Marcaine.  Laparoscope was advanced into the abdominal cavity under direct visualization with the Optiview cannula.  The patient's abdomen was insufflated with carbon dioxide.  A second port was placed in the left lower quadrant, 3 cm medial to the left  anterior iliac spine.  A #10 port was advanced into the abdominal cavity under direct visualization.  Third port was placed in right lower quadrant, again 3 cm medial to the right anterior iliac spine.  A 5-mm port was advanced under direct visualization.  The patient was placed in Trendelenburg. There was noted to be clotted and free-flowing blood in the posterior cul-de-sac, approximately 150 mL.  The right fallopian tube was distended with a large ectopic pregnancy at the mid to distal portion of the tube.  The fallopian tube was grasped on the right and Harmonic scalpel was brought up and the mesosalpinx was dissected, and the right fallopian tube with ectopic was dissected at the cornua.  The ectopic pregnancy with fallopian tube was placed in the endobag and removed through the left lower port site.  Left fallopian tube and ovary appeared normal as did the right ovary.  The patient's abdomen was copiously irrigated and suctioned, and the pressure was brought down to 7 mmHg, and the surgical site was hemostatic.  The Carter-Thomason cone was brought up to the operative field, and the left lower port site was closed with 2 figure-of-eight sutures with good fascial closure.  The patient's abdomen was then deflated, and the infraumbilical incision was closed with a fascial 2-0 Vicryl suture, and all skin incisions were closed with interrupted 4-0 Vicryl suture.  Good cosmetic effect, good hemostasis were noted.  The single-tooth tenaculum was removed from the anterior cervix, and the Kahn cannula was removed, and silver nitrate was applied to the single-tooth tenaculum sites.  The patient tolerated the procedure well.  She received 30 mg of intravenous Toradol at the end of the case.  INTRAOPERATIVE FLUIDS:  900 mL.  URINE OUTPUT:  200 mL.  ESTIMATED BLOOD LOSS:  150 mL hemoperitoneum and minimal blood loss for the surgical procedure.  The patient was taken to the recovery room in  good condition          ______________________________ Jennell Corner, MD     TS/MEDQ  D:  06/14/2017  T:  06/14/2017  Job:  [id number]

## 2017-06-14 NOTE — Brief Op Note (Signed)
06/13/2017 - 06/14/2017  9:07 PM  PATIENT:  Tamara Keller  36 y.o. female  PRE-OPERATIVE DIAGNOSIS:  Right tubal ectopic  POST-OPERATIVE DIAGNOSIS:  Right tubal ectopic  PROCEDURE:  Laparocopic right salpingectomy with ectopic  SURGEON:  Surgeon(s) and Role:    * Schermerhorn, Ihor Austin, MD - Primary  PHYSICIAN ASSISTANT: scrub tech   ASSISTANTS: none   ANESTHESIA:   GETA EBL:  150cc hemoperitoneum, minimal loss at surgery  200 urine  IOF 900 cc   BLOOD ADMINISTERED:none  DRAINS: none   LOCAL MEDICATIONS USED:  MARCAINE     SPECIMEN:  Source of Specimen:  right fallopian tube and ectopic  DISPOSITION OF SPECIMEN:  PATHOLOGY  COUNTS:  YES  TOURNIQUET:  * No tourniquets in log *  DICTATION: .Other Dictation: Dictation Number verbal  PLAN OF CARE: Discharge to home after PACU  PATIENT DISPOSITION:  PACU - hemodynamically stable.   Delay start of Pharmacological VTE agent (>24hrs) due to surgical blood loss or risk of bleeding: not applicable

## 2017-06-14 NOTE — Progress Notes (Signed)
Subjective: Patient reports no problems voiding.   No significant pain overnight .  Objective: I have reviewed patient's vital signs.  General: alert and cooperative Resp: clear to auscultation bilaterally Cardio: regular rate and rhythm, S1, S2 normal, no murmur, click, rub or gallop GI: soft, non-tender; bowel sounds normal; no masses,  no organomegaly   Assessment/Plan: Ectopic pregnancy under observation No pain overnight  Will recheck U/S and CBc and Bhcg today . If normal and hcg is not rising I will d/c home with precautions    LOS: 0 days    Tamara Keller 06/14/2017, 8:39 AM

## 2017-06-14 NOTE — Anesthesia Procedure Notes (Signed)
Procedure Name: Intubation Date/Time: 06/14/2017 8:03 PM Performed by: Ginger Carne Pre-anesthesia Checklist: Patient identified, Emergency Drugs available, Suction available, Patient being monitored and Timeout performed Patient Re-evaluated:Patient Re-evaluated prior to induction Oxygen Delivery Method: Circle system utilized Preoxygenation: Pre-oxygenation with 100% oxygen Induction Type: IV induction Ventilation: Mask ventilation without difficulty Laryngoscope Size: Miller and 2 Grade View: Grade I Tube type: Oral Tube size: 7.0 mm Number of attempts: 1 Airway Equipment and Method: Stylet Placement Confirmation: ETT inserted through vocal cords under direct vision,  positive ETCO2 and breath sounds checked- equal and bilateral Secured at: 21 cm Tube secured with: Tape Dental Injury: Teeth and Oropharynx as per pre-operative assessment

## 2017-06-14 NOTE — Anesthesia Preprocedure Evaluation (Signed)
Anesthesia Evaluation  Patient identified by MRN, date of birth, ID band Patient awake    Reviewed: Allergy & Precautions, H&P , NPO status , Patient's Chart, lab work & pertinent test results, reviewed documented beta blocker date and time   History of Anesthesia Complications Negative for: history of anesthetic complications  Airway Mallampati: III  TM Distance: >3 FB Neck ROM: full    Dental  (+) Teeth Intact, Dental Advidsory Given   Pulmonary neg pulmonary ROS,           Cardiovascular Exercise Tolerance: Good negative cardio ROS       Neuro/Psych negative neurological ROS  negative psych ROS   GI/Hepatic Neg liver ROS, GERD  ,  Endo/Other  negative endocrine ROS  Renal/GU negative Renal ROS  negative genitourinary   Musculoskeletal   Abdominal   Peds  Hematology negative hematology ROS (+)   Anesthesia Other Findings Past Medical History: No date: Allergy     Comment:  seasonal  No date: Sinusitis     Comment:  chronic, 2-3times per year   Reproductive/Obstetrics (+) Pregnancy (ectopic pregnancy; 7.5 weeks)                             Anesthesia Physical Anesthesia Plan  ASA: II and emergent  Anesthesia Plan: General   Post-op Pain Management:    Induction: Intravenous  PONV Risk Score and Plan: 3 and Ondansetron, Dexamethasone and Midazolam  Airway Management Planned: Oral ETT  Additional Equipment:   Intra-op Plan:   Post-operative Plan: Extubation in OR  Informed Consent: I have reviewed the patients History and Physical, chart, labs and discussed the procedure including the risks, benefits and alternatives for the proposed anesthesia with the patient or authorized representative who has indicated his/her understanding and acceptance.   Dental Advisory Given  Plan Discussed with: Anesthesiologist, CRNA and Surgeon  Anesthesia Plan Comments:          Anesthesia Quick Evaluation

## 2017-06-14 NOTE — Progress Notes (Signed)
Patient ID: Tamara Keller, female   DOB: 13-Sep-1981, 36 y.o.   MRN: [id number] Exam Information   Status Exam Begun  Exam Ended   Final [99] 06/14/2017 3:27 PM 06/14/2017 4:18 PM  PACS Images   Show images for US Transvaginal Non-OB  Study Result   CLINICAL DATA:  Pelvic pain  EXAM: TRANSABDOMINAL AND TRANSVAGINAL ULTRASOUND OF PELVIS  TECHNIQUE: Both transabdominal and transvaginal ultrasound examinations of the pelvis were performed. Transabdominal technique was performed for global imaging of the pelvis including uterus, ovaries, adnexal regions, and pelvic cul-de-sac. It was necessary to proceed with endovaginal exam following the transabdominal exam to visualize the uterus, endometrium, ovaries and adnexa .  COMPARISON:  06/13/2017  FINDINGS: Uterus  Measurements: 6.7 x 3.1 x 3.8 cm. No fibroids or other mass visualized.  Endometrium  Thickness: 4 mm in thickness.  No focal abnormality visualized.  Right ovary  Measurements: 2.8 by 1.7 x 2.4 cm. 1.7 cm follicle. Hyperechoic area noted adjacent to the right ovary in the right adnexa measures 3.4 x 3.2 x 2.7 cm, slightly larger when compared to yesterday's study.  Left ovary  Measurements: 3.0 x 1.2 x 2.5 cm. Normal appearance/no adnexal mass.  Other findings  Small to moderate free fluid in the pelvis.  IMPRESSION: Continued hyperechoic solid mass in the right adnexa adjacent to the right ovary measuring 3.4 x 3.2 x 2.7 cm. This measures slightly larger than prior study. Cannot exclude ectopic pregnancy. Small to moderate free fluid in the pelvis.     results as above : right adnexal mass enlarging now 3.4x3.2x2.7 cm   HCT from 38.8 to 32.2 and now small to moderate fluid in the cul-de-sac . All of which are very concerning even though her Sharene Butters has decreased to 1572 and her pain is minimal .   My recommendation is to perform a L/S evaluation with either a right salpingostomy  or salpingectomy . Possible oophorectomy if the ectopic is attached to the right ovary .  I have thoroughly counseled the patient and her husband . I have reviewed the possible risks of organ injury ( bowel , bladder , ureter ) and the possibility of blood transfusion and the risk of infectious disease if blood is transfused . There is a small possibility that a laparotomy would have to be performed . All questions have been answered      .

## 2017-06-15 ENCOUNTER — Encounter: Payer: Self-pay | Admitting: Obstetrics and Gynecology

## 2017-06-15 NOTE — Progress Notes (Signed)
Pt discharged home with family. Discharge orders were unable to print. MD was notified and stated to send pt home. Discharge instructions, medications and follow up appointment were reviewed with pt and family. Pt stated she understood and signed a discharge note by RN.

## 2017-06-17 LAB — SURGICAL PATHOLOGY

## 2019-02-25 ENCOUNTER — Other Ambulatory Visit: Payer: Self-pay | Admitting: Obstetrics & Gynecology

## 2019-02-25 DIAGNOSIS — N979 Female infertility, unspecified: Secondary | ICD-10-CM

## 2019-03-08 ENCOUNTER — Ambulatory Visit: Payer: BLUE CROSS/BLUE SHIELD

## 2019-03-08 ENCOUNTER — Ambulatory Visit
Admission: RE | Admit: 2019-03-08 | Discharge: 2019-03-08 | Disposition: A | Discharge status: Home / Self Care | Payer: BLUE CROSS/BLUE SHIELD | Source: Ambulatory Visit | Attending: Obstetrics & Gynecology | Admitting: Obstetrics & Gynecology

## 2019-03-08 ENCOUNTER — Other Ambulatory Visit: Payer: Self-pay

## 2019-03-08 DIAGNOSIS — N979 Female infertility, unspecified: Secondary | ICD-10-CM

## 2019-03-08 MED ORDER — IOPAMIDOL (ISOVUE-370) INJECTION 76%: 20.0000 mL | Freq: Once | INTRAVENOUS | Status: DC | PRN

## 2019-03-08 NOTE — Progress Notes (Signed)
[id number] Tamara Keller Nov 17, 1980 38 y.o.   Hysterosalpingogram Procedure Note  Date of procedure: 03/08/2019   Pre-operative Diagnosis: Infertility, history of ectopic  Post-operative Diagnosis: same, absent RIGHT tube, occlusion of distal left tube  Procedure: Hysterosalpingogram  Surgeon: Ranae Plumber, MD  Assistant(s):  Radiology assistant. Today's performing radiologist read the imaging and agreed with findings below.  Anesthesia: 1% lidocaine, 15cc in paracervical block  Estimated Blood Loss:  minimal         Complications:  None apparent  Disposition: To home         Condition: stable, minimal cramping  Findings: normal endometrial contour, no filling of right tube (absent), and filling but no spillage from left tube.  There was a pooling of the dye within the fimbriated end.  Procedure Details  HSG procedure discussed with the patient.  Risks, complications, alternatives have been reviewed with her and she agrees to proceed.   The patient presented to the radiology lab and was identified as the correct patient and the procedure verified as an HSG. A verbal Time Out was held with all team members present and in agreement.  She had taken preoperative oral medications prescribed by me.  Speculum was inserted in to the vagina and the cervix visualized.  The cervix was cleaned with betadine solution. Single tooth tenaculum was placed at 12:00 on the anterior cervix.  A paracervical block was placed at 4:00 and 8:00.  The HSG catheter was inserted and the balloon insufflated with approximately 1.5 ml of air.  Patient was then repositioned for fluoroscopy.  A total of 5 ml of contrast was used for the procedure, with much resistance after this, expelling the balloon-inflated catheter.  It was replaced and reinflated and again no further injection could occur without much resistance and pain from patient. The patient overall tolerated the procedure well, no complications.   A  normal endometrial contour was noted, with absent spillage from the left tube.  Results were reviewed with the patient at the time of the procedure. She verbalized understanding.   ----- Ranae Plumber, MD Attending Obstetrician and Gynecologist [facility]

## 2019-03-17 NOTE — Progress Notes (Signed)
Patient ID: Tamara Keller, female   DOB: May 06, 1981, 38 y.o.   MRN: [id number]   [id number] ZURIANA MCGURL 02/08/1981 38 y.o.   Hysterosalpingogram Procedure Note  Date of procedure: 03/08/2019   Pre-operative Diagnosis: Infertility, history of ectopic  Post-operative Diagnosis: same, absent RIGHT tube, occlusion of distal left tube  Procedure: Hysterosalpingogram  Surgeon: Ranae Plumber, MD  Assistant(s):  Radiology assistant. Today's performing radiologist read the imaging and agreed with findings below.  Anesthesia: 1% lidocaine, 15cc in paracervical block  Estimated Blood Loss:  minimal         Complications:  None apparent  Disposition: To home         Condition: stable, minimal cramping  Findings: normal endometrial contour, no filling of right tube (absent), and filling but no spillage from left tube.  There was a pooling of the dye within the fimbriated end.  Procedure Details  HSG procedure discussed with the patient.  Risks, complications, alternatives have been reviewed with her and she agrees to proceed.   The patient presented to the radiology lab and was identified as the correct patient and the procedure verified as an HSG. A verbal Time Out was held with all team members present and in agreement.  She had taken preoperative oral medications prescribed by me.  Speculum was inserted in to the vagina and the cervix visualized.  The cervix was cleaned with betadine solution. Single tooth tenaculum was placed at 12:00 on the anterior cervix.  A paracervical block was placed at 4:00 and 8:00.  The HSG catheter was inserted and the balloon insufflated with approximately 1.5 ml of air.  Patient was then repositioned for fluoroscopy.  A total of 5 ml of contrast was used for the procedure, with much resistance after this, expelling the balloon-inflated catheter.  It was replaced and reinflated and again no further injection could occur without much resistance and pain from  patient. The patient overall tolerated the procedure well, no complications.   A normal endometrial contour was noted, with absent spillage from the left tube.  Results were reviewed with the patient at the time of the procedure. She verbalized understanding.   ----- Ranae Plumber, MD Attending Obstetrician and Gynecologist [name]

## 2019-03-24 NOTE — Op Note (Signed)
Patient ID: Tamara Keller, female   DOB: 11/21/1980, 37 y.o.   MRN: [id number]   [id number] Tamara Keller 03/02/1981 37 y.o.   Hysterosalpingogram Procedure Note  Date of procedure: 03/08/2019   Pre-operative Diagnosis: Infertility, history of ectopic  Post-operative Diagnosis: same, absent RIGHT tube, occlusion of distal left tube  Procedure: Hysterosalpingogram  Surgeon: Kaylee Wombles, MD  Assistant(s):  Radiology assistant. Today's performing radiologist read the imaging and agreed with findings below.  Anesthesia: 1% lidocaine, 15cc in paracervical block  Estimated Blood Loss:  minimal         Complications:  None apparent  Disposition: To home         Condition: stable, minimal cramping  Findings: normal endometrial contour, no filling of right tube (absent), and filling but no spillage from left tube.  There was a pooling of the dye within the fimbriated end.  Procedure Details  HSG procedure discussed with the patient.  Risks, complications, alternatives have been reviewed with her and she agrees to proceed.   The patient presented to the radiology lab and was identified as the correct patient and the procedure verified as an HSG. A verbal Time Out was held with all team members present and in agreement.  She had taken preoperative oral medications prescribed by me.  Speculum was inserted in to the vagina and the cervix visualized.  The cervix was cleaned with betadine solution. Single tooth tenaculum was placed at 12:00 on the anterior cervix.  A paracervical block was placed at 4:00 and 8:00.  The HSG catheter was inserted and the balloon insufflated with approximately 1.5 ml of air.  Patient was then repositioned for fluoroscopy.  A total of 5 ml of contrast was used for the procedure, with much resistance after this, expelling the balloon-inflated catheter.  It was replaced and reinflated and again no further injection could occur without much resistance and pain from  patient. The patient overall tolerated the procedure well, no complications.   A normal endometrial contour was noted, with absent spillage from the left tube.  Results were reviewed with the patient at the time of the procedure. She verbalized understanding.   ----- Braydee Shimkus, MD Attending Obstetrician and Gynecologist [facility]    

## 2019-10-29 ENCOUNTER — Ambulatory Visit: Payer: BC Managed Care – PPO | Attending: Internal Medicine

## 2019-10-29 DIAGNOSIS — Z20822 Contact with and (suspected) exposure to covid-19: Secondary | ICD-10-CM

## 2019-10-30 LAB — NOVEL CORONAVIRUS, NAA: SARS-CoV-2, NAA: NOT DETECTED

## 2020-01-17 ENCOUNTER — Ambulatory Visit: Payer: BC Managed Care – PPO | Attending: Internal Medicine

## 2020-01-17 DIAGNOSIS — Z23 Encounter for immunization: Secondary | ICD-10-CM

## 2020-01-17 NOTE — Progress Notes (Signed)
   Covid-19 Vaccination Clinic  Name:  Tamara Keller    MRN: [id number] DOB: 1981/07/06  01/17/2020  [name] was observed post Covid-19 immunization for 15 minutes without incident. She was provided with Vaccine Information Sheet and instruction to access the V-Safe system.   [name] was instructed to call 911 with any severe reactions post vaccine: Marland Kitchen Difficulty breathing  . Swelling of face and throat  . A fast heartbeat  . A bad rash all over body  . Dizziness and weakness   Immunizations Administered    Name Date Dose VIS Date Route   Pfizer COVID-19 Vaccine 01/17/2020 12:30 PM 0.3 mL 10/01/2019 Intramuscular   Manufacturer: [employer], [employer]   Lot: [id number]   NDC: [address]-5

## 2020-02-09 ENCOUNTER — Ambulatory Visit: Payer: BC Managed Care – PPO | Attending: Internal Medicine

## 2020-02-09 DIAGNOSIS — Z23 Encounter for immunization: Secondary | ICD-10-CM

## 2020-02-09 NOTE — Progress Notes (Signed)
   Covid-19 Vaccination Clinic  Name:  Tamara Keller    MRN: [id number] DOB: [birth year]  02/09/2020  [name] was observed post Covid-19 immunization for 15 minutes without incident. She was provided with Vaccine Information Sheet and instruction to access the V-Safe system.   [name] was instructed to call 911 with any severe reactions post vaccine: Marland Kitchen Difficulty breathing  . Swelling of face and throat  . A fast heartbeat  . A bad rash all over body  . Dizziness and weakness   Immunizations Administered    Name Date Dose VIS Date Route   Pfizer COVID-19 Vaccine 02/09/2020  3:53 PM 0.3 mL 12/15/2018 Intramuscular   Manufacturer: [employer], [employer]   Lot: [id number]   NDC: [address]-2

## 2020-06-19 ENCOUNTER — Other Ambulatory Visit: Payer: Self-pay | Admitting: Obstetrics & Gynecology

## 2020-06-19 DIAGNOSIS — N644 Mastodynia: Secondary | ICD-10-CM

## 2020-06-23 ENCOUNTER — Other Ambulatory Visit: Payer: Self-pay

## 2020-06-23 ENCOUNTER — Ambulatory Visit
Admission: RE | Admit: 2020-06-23 | Discharge: 2020-06-23 | Disposition: A | Discharge status: Home / Self Care | Payer: BC Managed Care – PPO | Source: Ambulatory Visit | Attending: Obstetrics & Gynecology | Admitting: Obstetrics & Gynecology

## 2020-06-23 DIAGNOSIS — N644 Mastodynia: Secondary | ICD-10-CM

## 2021-05-24 ENCOUNTER — Other Ambulatory Visit: Payer: Self-pay | Admitting: Obstetrics and Gynecology

## 2021-05-24 DIAGNOSIS — Z1231 Encounter for screening mammogram for malignant neoplasm of breast: Secondary | ICD-10-CM

## 2021-07-16 ENCOUNTER — Other Ambulatory Visit: Payer: Self-pay | Admitting: Family Medicine

## 2021-07-16 DIAGNOSIS — M79621 Pain in right upper arm: Secondary | ICD-10-CM

## 2021-07-19 ENCOUNTER — Ambulatory Visit
Admission: RE | Admit: 2021-07-19 | Discharge: 2021-07-19 | Disposition: A | Discharge status: Home / Self Care | Payer: Self-pay | Source: Ambulatory Visit | Attending: Family Medicine | Admitting: Family Medicine

## 2021-07-19 ENCOUNTER — Other Ambulatory Visit: Payer: Self-pay

## 2021-07-19 ENCOUNTER — Ambulatory Visit
Admission: RE | Admit: 2021-07-19 | Discharge: 2021-07-19 | Disposition: A | Discharge status: Home / Self Care | Payer: BC Managed Care – PPO | Source: Ambulatory Visit | Attending: Family Medicine | Admitting: Family Medicine

## 2021-07-19 DIAGNOSIS — M79621 Pain in right upper arm: Secondary | ICD-10-CM

## 2022-02-18 HISTORY — PX: AUGMENTATION MAMMAPLASTY: SUR837

## 2022-06-10 ENCOUNTER — Other Ambulatory Visit: Payer: Self-pay | Admitting: Obstetrics and Gynecology

## 2022-06-10 DIAGNOSIS — Z1231 Encounter for screening mammogram for malignant neoplasm of breast: Secondary | ICD-10-CM

## 2022-08-27 ENCOUNTER — Ambulatory Visit: Payer: Self-pay | Attending: Obstetrics and Gynecology

## 2022-12-02 ENCOUNTER — Ambulatory Visit
Admission: RE | Admit: 2022-12-02 | Discharge: 2022-12-02 | Disposition: A | Discharge status: Home / Self Care | Payer: BC Managed Care – PPO | Source: Ambulatory Visit | Attending: Obstetrics and Gynecology | Admitting: Obstetrics and Gynecology

## 2022-12-02 DIAGNOSIS — Z1231 Encounter for screening mammogram for malignant neoplasm of breast: Secondary | ICD-10-CM | POA: Insufficient documentation

## 2023-03-20 IMAGING — MG DIGITAL DIAGNOSTIC BILAT W/ TOMO W/ CAD
6 of 10 series · 6 of 30 positions shown · non-contrast
Comparison: Previous exam(s).

CLINICAL DATA: Right axillary pain.

EXAM:
DIGITAL DIAGNOSTIC BILATERAL MAMMOGRAM WITH TOMOSYNTHESIS AND CAD;
ULTRASOUND RIGHT BREAST LIMITED
TECHNIQUE: Bilateral digital diagnostic mammography and breast tomosynthesis
was performed. The images were evaluated with computer-aided
detection.; Targeted ultrasound examination of the right breast was
performed

[R MLO synth-2D (1 of 2)]
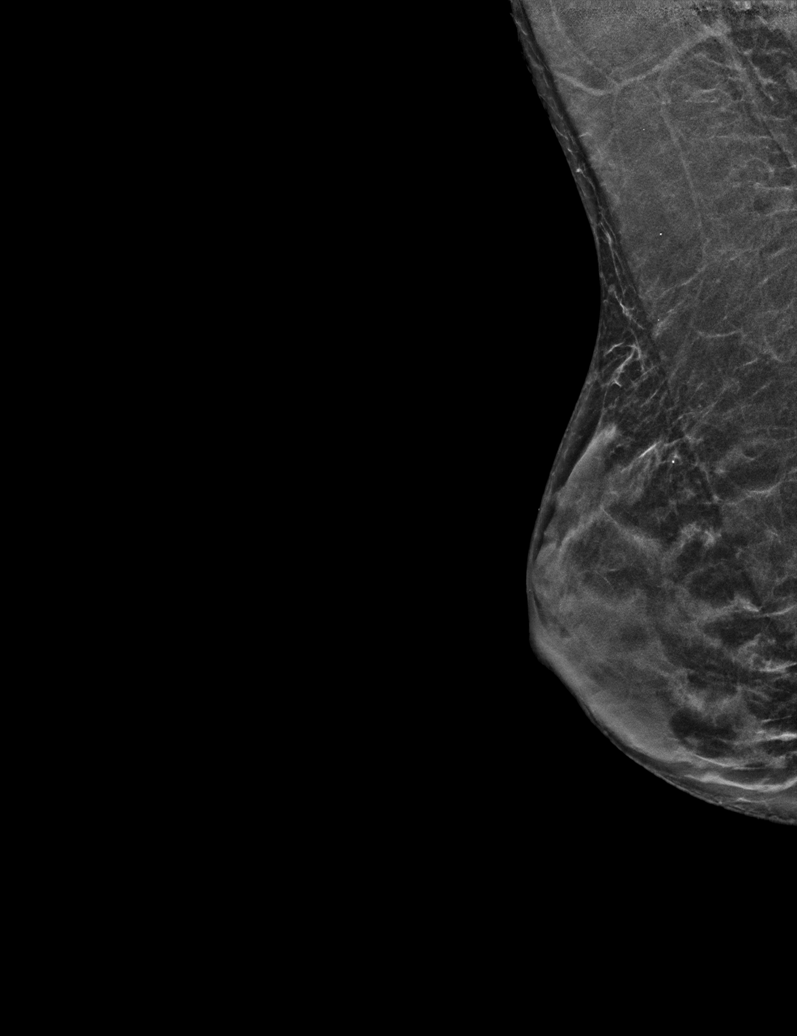

[R CC synth-2D]
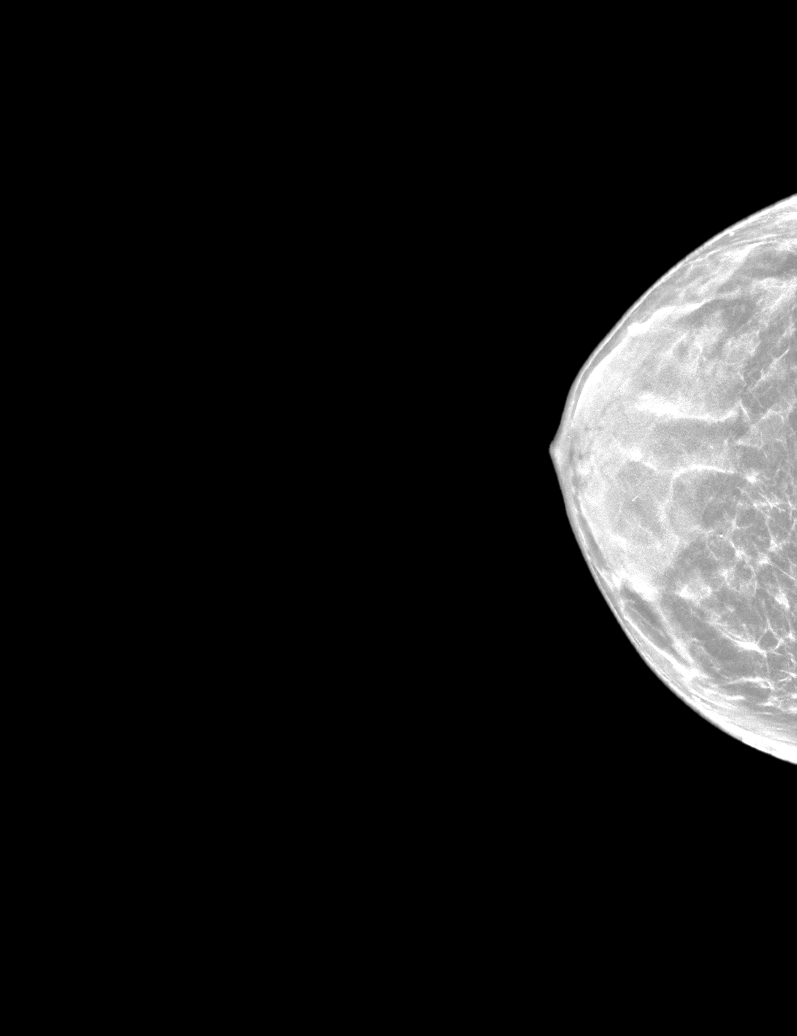

[L MLO synth-2D]
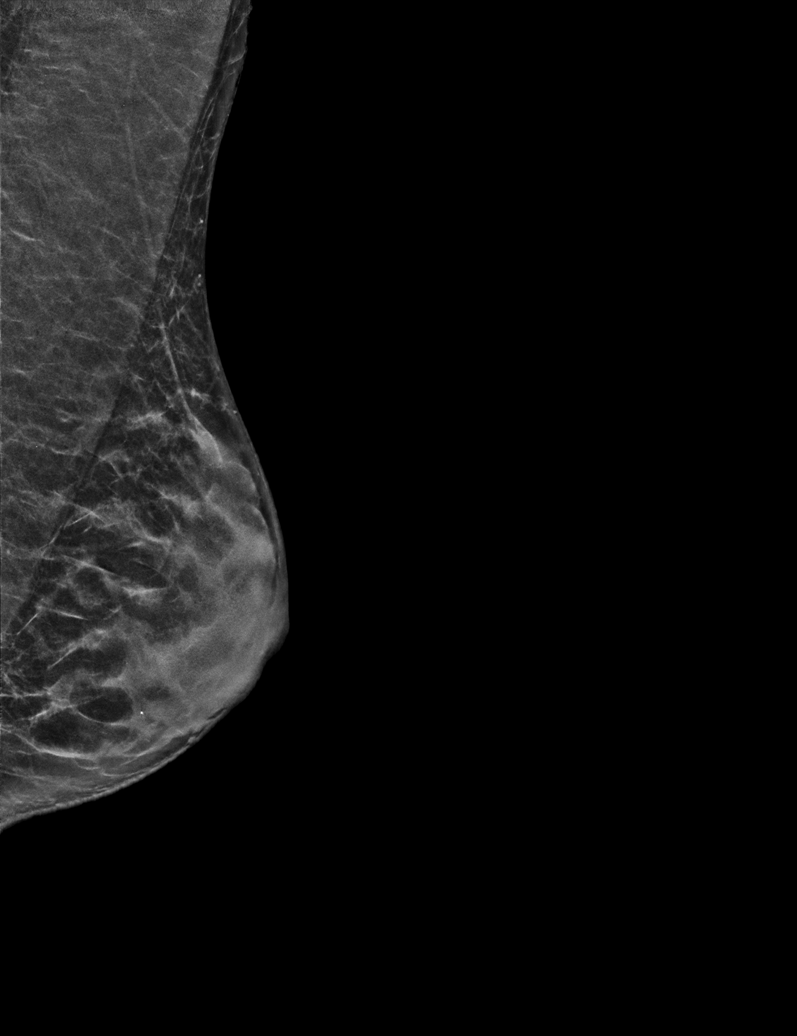

[L CC synth-2D]
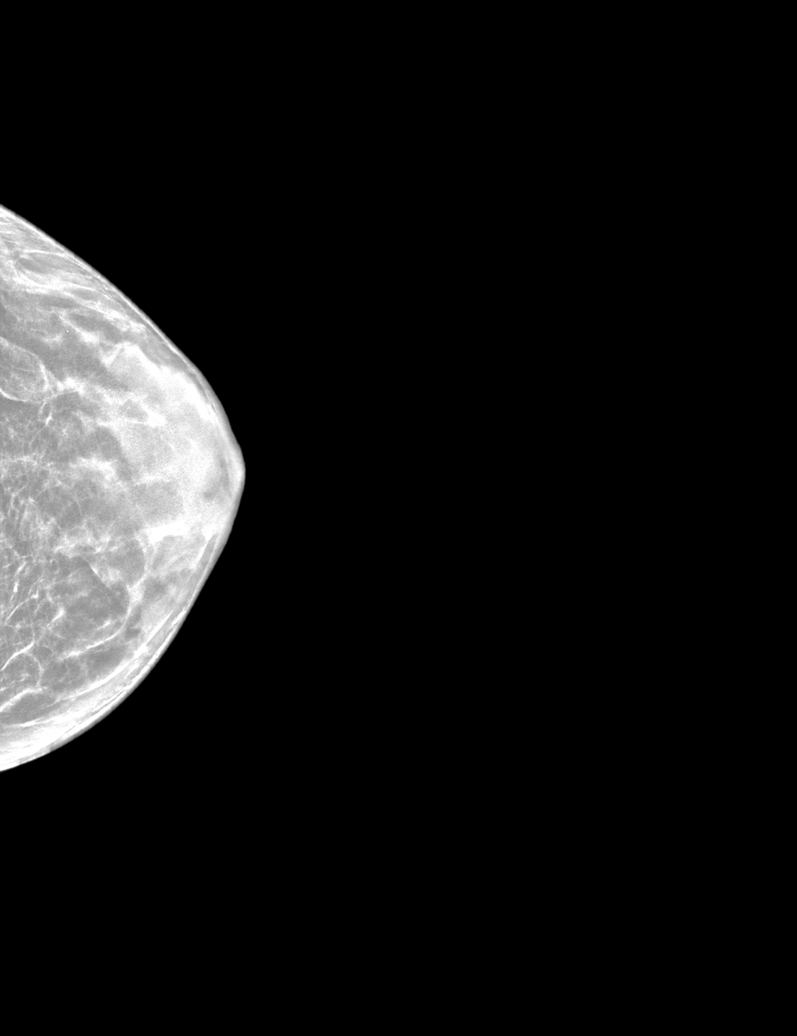

[R MLO synth-2D (2 of 2)]
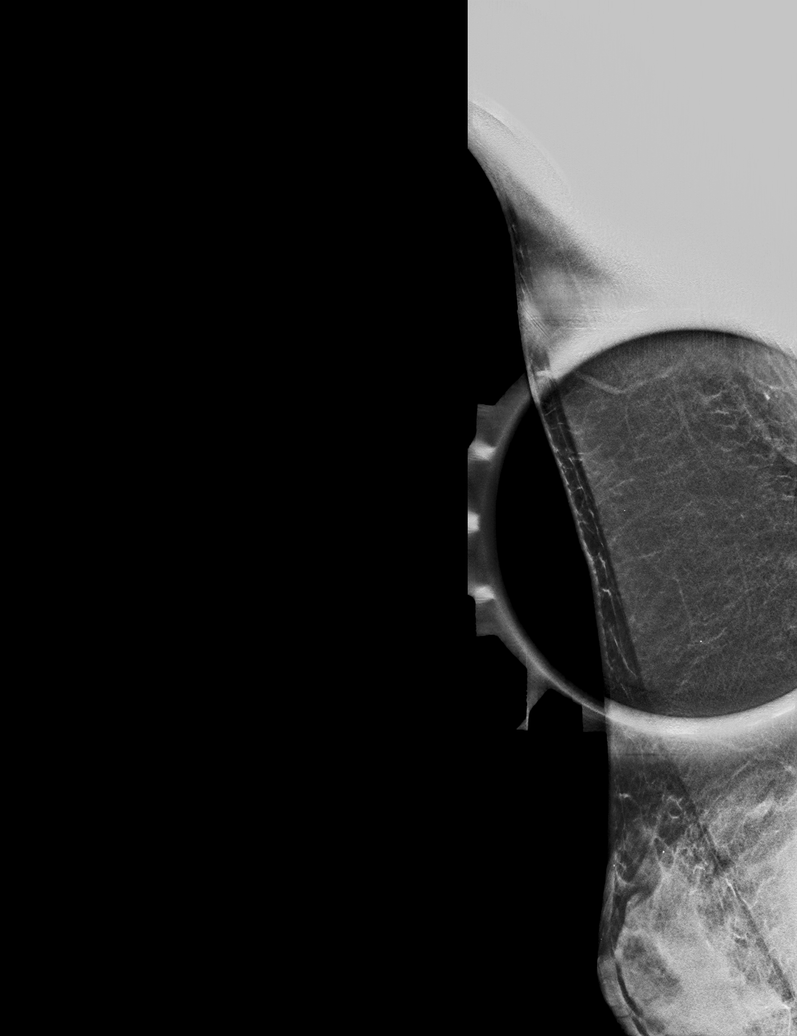

[R MLO tomo · tomo slice 24/47.0]
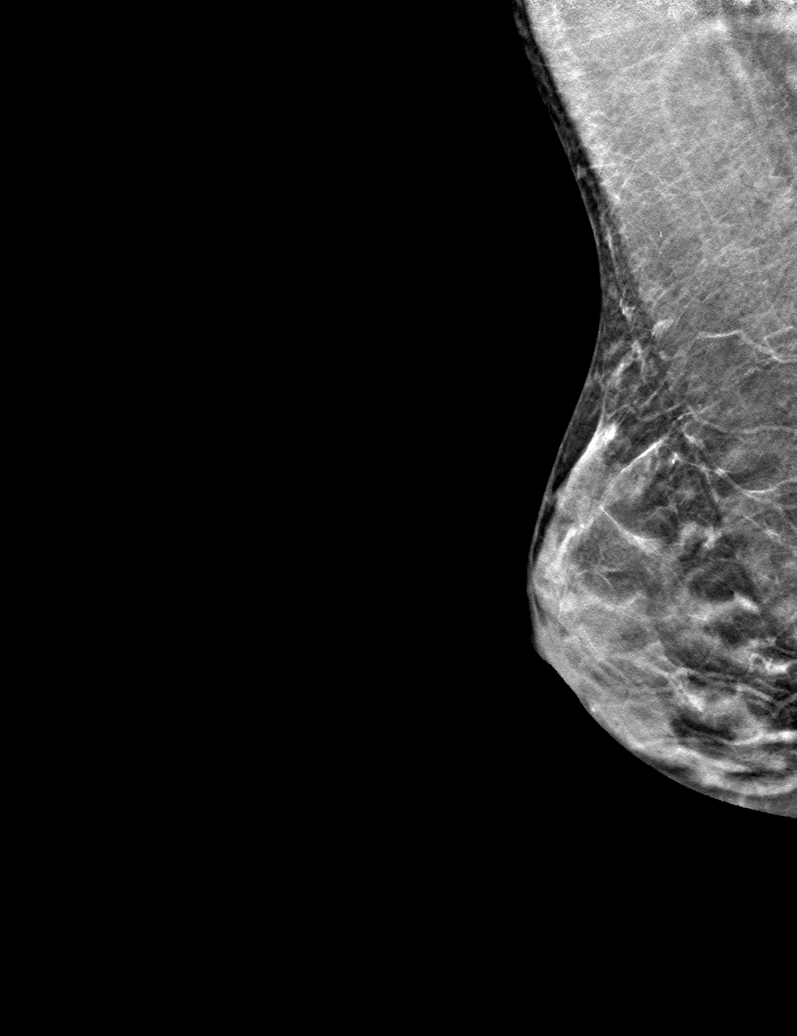

[6 of 30 positions shown; findings below may reference images not displayed]

ACR Breast Density Category d: The breast tissue is extremely dense,
which lowers the sensitivity of mammography.
FINDINGS: Mammographically, there are no suspicious masses, areas of
architectural distortion or microcalcifications in either breast. No
evidence of right axillary lymphadenopathy.

Targeted ultrasound of the right axilla and axillary tail of the
right breast demonstrate no suspicious masses or shadowing lesions.
No evidence of lymphadenopathy.
IMPRESSION: No mammographic or sonographic evidence of breast malignancy.

RECOMMENDATION:
Screening mammogram in one year.(Code:[id number]-R6R)

I have discussed the findings and recommendations with the patient.
If applicable, a reminder letter will be sent to the patient
regarding the next appointment.

BI-RADS CATEGORY  1: Negative.

## 2023-10-08 ENCOUNTER — Other Ambulatory Visit: Payer: Self-pay | Admitting: Obstetrics and Gynecology

## 2023-10-08 DIAGNOSIS — R9230 Dense breasts, unspecified: Secondary | ICD-10-CM

## 2024-02-19 NOTE — Progress Notes (Signed)
 Otolaryngology Clinic Note  HPI:    New Patient (Pt presents for recurring sinus infections. Three infections since December )    Tamara Keller is a 43 y.o. female who presents as a new consult patient,for evaluation and treatment of recurrent sinusitis.  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 The condition began with ear infections in infancy, progressing to sinus infections that have become increasingly recurrent, significantly impacting her quality of life. 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 Her treatment history includes Augmentin, Biaxin, and Z-Pak, with a recent return to Augmentin and the addition of prednisone. Augmentin has been effective, but the infection recurs rapidly. She has tried Flonase , irrigation systems, and Neti pots without significant relief. Her symptoms develop rapidly, often within a day, and include swollen lymph nodes, congestion, dark green mucus, coughing (both dry and productive), fatigue, throat pain, and sweating without fever. She reports facial swelling without pain and no changes in smell or taste. She has required prednisone 3 times for severe chest inflammation and pressure.. The last antibiotic course was in April. She saw an allergy specialist as a child and underwent prick testing, which was inconclusive. She reports no history of nasal trauma or surgery.   PMH/Meds/All/SocHx/FamHx/ROS:   Past Medical History:  Diagnosis Date  . 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 Allergy   . Anal itching 05/20/2013  . Appendicitis 06/2014   positive abdominal CT treated with Cipro and flagyl. Patient failed follow up CT  . Depression 08/23/2011   not currently active. history of an eating disorder.  . Dermatitis   . Family history of blood clots 08/23/2011     Past Surgical History:  Procedure Laterality Date  . CERVICAL BIOPSY  W/ LOOP ELECTRODE EXCISION  2007   Procedure: CERVICAL BIOPSY  W/ LOOP ELECTRODE EXCISION    No family history of bleeding disorders, wound healing problems or difficulty with anesthesia.   Social History   Socioeconomic History  . Marital status: Married    Spouse name: Not on file  . Number of children: Not on file  . Years of education: Not on file  . Highest education level: Not on file  Occupational History  . Not on file  Tobacco Use  . Smoking status: Never  . Smokeless tobacco: Never  Substance and Sexual Activity  . Alcohol use: Yes  . Drug use: No  . Sexual activity: Not on file  Other Topics Concern  . 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 Went to [employer]. Law degree.     Runs half marathons    She has 3 cats.   Social Drivers of Health   Food Insecurity: Not on file  Transportation Needs: Not on file  Safety: Not on file  Living Situation: Unknown (10/23/2023)   Received from [facility]   Living Situation   . Unable to Pay for Housing in the Last Year: Not on file   . Number of Times Moved in the Last Year: Not on file   . At any time in the past 12 months, were you homeless or living in a shelter (including now)?: No    Current Outpatient Medications: .  loratadine (CLARITIN) 5 mg/5 mL solution, Take  by mouth., Disp: , Rfl: .  therapeutic multivitamin (Oncovite) tab, Take 1 tablet by mouth Once Daily., Disp: , Rfl: .  azithromycin (ZITHROMAX) 250 mg tablet, Take two tablets on the first day and then one tablet every day after. (Patient not taking: Reported on 02/19/2024), Disp: 10 tablet, Rfl: 1  A complete ROS was performed with pertinent positives/negatives noted in the HPI. The remainder of the ROS are negative.    Physical Exam:    BP 122/81   Pulse 75   Temp  98 F (36.7 C) (Temporal)   Ht 1.651 m (5' 5)   Wt 62.9 kg (138 lb 9.6 oz)   BMI 23.06 kg/m    General: Well developed, well nourished. No acute distress.   Head/Face: Normocephalic, atraumatic. No scars or lesions. No sinus tenderness. Facial nerve intact and equal bilaterally.   Eyes: Pupils are equal, round and reactive to light. Conjunctiva and lids are normal. Normal extraocular mobility.   Ears:   Right: Pinna and external meatus normal, normal ear canal skin and caliber without excessive cerumen or drainage. Tympanic membrane intact without effusion or infection.    Left: Pinna and external meatus normal, normal ear canal skin and caliber without excessive cerumen or drainage. Tympanic membrane intact without effusion or infection.   Nose: No gross deformity or lesions. No purulent discharge. See nasal endoscop7  Mouth/Oropharynx: Lips normal, teeth and gums normal with good dentition, normal oral vestibule. Normal floor of mouth, tongue and oral mucosa, no mucosal lesions, ulcer or mass, normal tongue mobility. Uvula midline. Hard and soft palate normal with normal mobility. Symmetric tonsils, no erythema or exudate.  Larynx: See TFL if applicable  Nasopharynx: See TFL if applicable  Neck: Trachea midline. No masses. No crepitus. Normal thyroid glad palpation without thyromegaly or nodules palpated. Salivary glands normal to palpation without swelling, erythema or mass.   Lymphatic: No lymphadenopathy in the neck.  Respiratory: No stridor or distress.  Extremities: No edema or cyanosis. Warm and well-perfused.  Neurologic: CN II-XII intact. Alert and oriented to self, place and time.  Normal reflexes and motor skills, balance and coordination. Moving all extremities without gross abnormality.  Psychiatric:  No unusual anxiety or evidence of depression. Appropriate affect.    Independent Review of Additional Tests or Records:  Documentation from recent telehealth visit  reviewed  Procedures:   Nasal/Sinus Endoscopy  Date/Time: 02/19/2024 11:00 AM  Performed by: Gerard Jenkins Shope, DO Authorized by: Gerard Jenkins Shope, DO  Local anesthesia used: yes  Anesthesia: Local anesthesia used: yes Local Anesthetic: topical anesthetic  Sedation: Patient sedated: no  Comments: Procedure Note - Nasal Endoscopy   Risks/benefits and possible complications of this procedure were discussed in detail and the patient understood and agreed to proceed.  The nose was sprayed with oxymetazoline and 4% lidocaine . With the patient in the upright position, the flexible endscope was inserted into the nasal passage bilaterally.  Where visible, nasal secretions and mucosal crusting were removed with suction. The overall appearance of the nasal cavity and paranasal sinuses were noted and the findings are described below.  Findings: Clear rhinorrhea in bilateral nasal cavities with no evidence of mucopurulent drainage.  Mild left septal deviation with bony spurring noted.  No nasal polyps.  No significant adenoid hypertrophy, nasopharynx patent.   The patient tolerated the procedure without difficulty.         Impression & Plans:  Tamara Keller is a 43 y.o.  female with history of allergic rhinitis presenting for evaluation of recurrent acute sinusitis.  Patient states that in the last several months, she has required treatment with multiple rounds of oral antibiotics in addition to oral prednisone.  She last completed a round of antibiotics approximately 2 to 3 weeks ago.  She continues to experience symptoms of postnasal drainage and intermittent fatigue.  Nasal endoscopy performed with findings as above, overall reassuring with no evidence of persistent infection.  Due to patient's ongoing symptoms, I recommend a CT sinus for further evaluation, as well as referral to allergy and immunology for consideration of testing.  Further recommendations pending results of  testing.   Meghan Jenkins Shope, DO Otolaryngology

## 2024-04-22 ENCOUNTER — Telehealth: Payer: Self-pay | Admitting: Allergy & Immunology

## 2024-04-22 NOTE — Telephone Encounter (Signed)
 PT called to ask about CT scan results and to discuss with Dr Iva, stating it shouldn't take more than a week to review these results. I advised that I would send back to provider for review

## 2024-04-22 NOTE — Telephone Encounter (Signed)
 I called the patient and left a message regarding the CT scan. The patient is a new patient for Dr. Iva and is scheduled to be seen in August. From what I can see on our end  the patient has not done a CT Scan recently unless it was done outside of [facility].

## 2024-04-22 NOTE — Telephone Encounter (Signed)
 I really cannot comment on the CT since I have not seen the patient and I did not order the CT.  Like Diandra - I do not see a CT ordered or performed.   I did review her chart and she saw Dr. Llewellyn for an evaluation on May 1 and a sinus CT was mentioned there.  However, I still do not see the results.  Marty Shaggy, MD [facility] 

## 2024-04-27 NOTE — Telephone Encounter (Signed)
 I called the patient and left another message to call the office back.

## 2024-06-08 ENCOUNTER — Ambulatory Visit: Payer: Self-pay | Admitting: Allergy & Immunology

## 2024-06-22 ENCOUNTER — Ambulatory Visit: Payer: Self-pay | Admitting: Allergy & Immunology
